# Patient Record
Sex: Female | Born: 1995 | Race: White | Hispanic: No | Marital: Single | Smoking: Never smoker
Health system: Southern US, Community
[De-identification: ages and names within clinical notes are randomized; demographics above are authoritative.]

## PROBLEM LIST (undated history)

## (undated) DIAGNOSIS — N159 Renal tubulo-interstitial disease, unspecified: Secondary | ICD-10-CM

## (undated) DIAGNOSIS — T7840XA Allergy, unspecified, initial encounter: Secondary | ICD-10-CM

## (undated) DIAGNOSIS — J45909 Unspecified asthma, uncomplicated: Secondary | ICD-10-CM

## (undated) HISTORY — DX: Renal tubulo-interstitial disease, unspecified: N15.9

## (undated) HISTORY — DX: Unspecified asthma, uncomplicated: J45.909

## (undated) HISTORY — DX: Allergy, unspecified, initial encounter: T78.40XA

---
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
## 2014-06-05 ENCOUNTER — Other Ambulatory Visit: Payer: Self-pay | Admitting: Physician Assistant

## 2014-06-09 ENCOUNTER — Other Ambulatory Visit: Payer: Self-pay | Admitting: Physician Assistant

## 2014-07-07 ENCOUNTER — Ambulatory Visit (INDEPENDENT_AMBULATORY_CARE_PROVIDER_SITE_OTHER): Payer: PPO

## 2014-07-07 ENCOUNTER — Ambulatory Visit (INDEPENDENT_AMBULATORY_CARE_PROVIDER_SITE_OTHER): Payer: PPO | Admitting: Family Medicine

## 2014-07-07 VITALS — BP 112/62 | HR 78 | Temp 98.7°F | Resp 18 | Ht 61.0 in | Wt 121.0 lb

## 2014-07-07 DIAGNOSIS — R309 Painful micturition, unspecified: Secondary | ICD-10-CM

## 2014-07-07 DIAGNOSIS — R0789 Other chest pain: Secondary | ICD-10-CM

## 2014-07-07 DIAGNOSIS — N23 Unspecified renal colic: Secondary | ICD-10-CM

## 2014-07-07 DIAGNOSIS — R1013 Epigastric pain: Secondary | ICD-10-CM

## 2014-07-07 DIAGNOSIS — G8929 Other chronic pain: Secondary | ICD-10-CM

## 2014-07-07 LAB — POCT UA - MICROSCOPIC ONLY
CASTS, UR, LPF, POC: NEGATIVE
CRYSTALS, UR, HPF, POC: NEGATIVE
Mucus, UA: NEGATIVE
YEAST UA: NEGATIVE

## 2014-07-07 LAB — POCT CBC
GRANULOCYTE PERCENT: 48.5 % (ref 37–80)
HCT, POC: 39.7 % (ref 37.7–47.9)
Hemoglobin: 12.9 g/dL (ref 12.2–16.2)
Lymph, poc: 4.3 — AB (ref 0.6–3.4)
MCH, POC: 27.6 pg (ref 27–31.2)
MCHC: 32.6 g/dL (ref 31.8–35.4)
MCV: 84.5 fL (ref 80–97)
MID (CBC): 0.1 (ref 0–0.9)
MPV: 9 fL (ref 0–99.8)
PLATELET COUNT, POC: 250 10*3/uL (ref 142–424)
POC Granulocyte: 4.2 (ref 2–6.9)
POC LYMPH PERCENT: 50 %L (ref 10–50)
POC MID %: 1.5 % (ref 0–12)
RBC: 4.69 M/uL (ref 4.04–5.48)
RDW, POC: 14.1 %
WBC: 8.7 10*3/uL (ref 4.6–10.2)

## 2014-07-07 LAB — POCT URINALYSIS DIPSTICK
Bilirubin, UA: NEGATIVE
GLUCOSE UA: NEGATIVE
Ketones, UA: NEGATIVE
NITRITE UA: NEGATIVE
PH UA: 7
PROTEIN UA: NEGATIVE
Spec Grav, UA: 1.015
UROBILINOGEN UA: 0.2

## 2014-07-07 LAB — POCT URINE PREGNANCY: Preg Test, Ur: NEGATIVE

## 2014-07-07 MED ORDER — CYCLOBENZAPRINE HCL 10 MG PO TABS: 10.0000 mg | ORAL_TABLET | Freq: Two times a day (BID) | ORAL | Status: AC | PRN

## 2014-07-07 MED ORDER — NITROFURANTOIN MONOHYD MACRO 100 MG PO CAPS: 100.0000 mg | ORAL_CAPSULE | Freq: Two times a day (BID) | ORAL | Status: AC

## 2014-07-07 NOTE — Patient Instructions (Signed)
I will get in touch with you with your other lab results.  In the meantime use the macrobid twice a day for possible UTI and the flexeril as needed for back pain. This is a muscle relaxer and may make you feel sleepy.  Do not take it when you need to drive.    Let me know if you are getting worse or if you are not better in the next couple of days.

## 2014-07-07 NOTE — Progress Notes (Addendum)
[facility] [address], [place] [address] [phone]  Date:  07/07/2014   Name:  Kayla SayersBriana Thornton   DOB:  1996/08/01   MRN:  [id number]  PCP:  No PCP Per Patient    Chief Complaint: Dysuria; Flank Pain; Abdominal Pain; Headache; and Adenopathy   History of Present Illness:  Kayla Thornton is a 18 y.o. very pleasant female patient who presents with the following:  Here today as a new patient with a possible kidney infection or UTI. She has a kidney infection a few years ago and notes similar sx.  She has noted chest, stomach and back pains for about one week.  This am she noted that it hurt to urinate. This has continued today.  She has noted frequency and urgency as well.   No hematuria.   She will notice a "tight feeling" in her chest that may occur a few times a day and last 5-20 minutes.  She will sometimes have a cough or SOB.  She has noted some sneezing and runny nose- thought it was allergies.   She has not noted a fever but has had some chills Also, she notes bilateral mid back pain for "a little over a week." Also, epigastric abdominal pain for about one week.  This does come and go.  No vomiting, but she has had some nausea.  No diarrhea.  She is able to eat ok She is generally in good health   There are no active problems to display for this patient.   Past Medical History  Diagnosis Date  . Allergy   . Asthma   . Kidney infection     No past surgical history on file.  History  Substance Use Topics  . Smoking status: Never Smoker   . Smokeless tobacco: Not on file  . Alcohol Use: No    Family History  Problem Relation Age of Onset  . Mental illness Brother   . Mental illness Sister     Allergies  Allergen Reactions  . Other     Dairy products     Medication list has been reviewed and updated.  Current Outpatient Prescriptions on File Prior to Visit  Medication Sig Dispense Refill  . loratadine (CLARITIN) 10 MG tablet  Take 10 mg by mouth daily.     No current facility-administered medications on file prior to visit.    Review of Systems:  As per HPI- otherwise negative.   Physical Examination: Filed Vitals:   07/07/14 1501  BP: 112/62  Pulse: 78  Temp: 98.7 F (37.1 C)  Resp: 18   Filed Vitals:   07/07/14 1501  Height: 5\' 1"  (1.549 m)  Weight: 121 lb (54.885 kg)   Body mass index is 22.87 kg/(m^2). Ideal Body Weight: Weight in (lb) to have BMI = 25: 132  GEN: WDWN, NAD, Non-toxic, A & O x 3, looks well HEENT: Atraumatic, Normocephalic. Neck supple. No masses, No LAD.  Bilateral TM wnl, oropharynx normal.  PEERL,EOMI.   Ears and Nose: No external deformity. CV: RRR, No M/G/R. No JVD. No thrill. No extra heart sounds. Able to reproduce her chest pain sx by pressing on her chest wall PULM: CTA B, no wheezes, crackles, rhonchi. No retractions. No resp. distress. No accessory muscle use. ABD: S, NT, ND, +BS. No rebound. No HSM.  No CVA tenderness.  Benign abd exam at this time EXTR: No c/c/e NEURO Normal gait.  PSYCH: Normally interactive. Conversant. Not depressed or  anxious appearing.  Calm demeanor.   Results for orders placed or performed in visit on 07/07/14  POCT urinalysis dipstick  Result Value Ref Range   Color, UA yellow    Clarity, UA clear    Glucose, UA neg    Bilirubin, UA neg    Ketones, UA neg    Spec Grav, UA 1.015    Blood, UA trace-lysed    pH, UA 7.0    Protein, UA neg    Urobilinogen, UA 0.2    Nitrite, UA neg    Leukocytes, UA Trace   POCT UA - Microscopic Only  Result Value Ref Range   WBC, Ur, HPF, POC 0-4    RBC, urine, microscopic 0-2    Bacteria, U Microscopic trace    Mucus, UA neg    Epithelial cells, urine per micros 0-3    Crystals, Ur, HPF, POC neg    Casts, Ur, LPF, POC neg    Yeast, UA neg   POCT urine pregnancy  Result Value Ref Range   Preg Test, Ur Negative   POCT CBC  Result Value Ref Range   WBC 8.7 4.6 - 10.2 K/uL   Lymph, poc  4.3 (A) 0.6 - 3.4   POC LYMPH PERCENT 50.0 10 - 50 %L   MID (cbc) 0.1 0 - 0.9   POC MID % 1.5 0 - 12 %M   POC Granulocyte 4.2 2 - 6.9   Granulocyte percent 48.5 37 - 80 %G   RBC 4.69 4.04 - 5.48 M/uL   Hemoglobin 12.9 12.2 - 16.2 g/dL   HCT, POC 78.239.7 95.637.7 - 47.9 %   MCV 84.5 80 - 97 fL   MCH, POC 27.6 27 - 31.2 pg   MCHC 32.6 31.8 - 35.4 g/dL   RDW, POC 21.314.1 %   Platelet Count, POC 250 142 - 424 K/uL   MPV 9.0 0 - 99.8 fL    EKG: NSR, no ST elevation or depression. Rate 67 BPM  [facility] reading (PRIMARY) by  Dr. Patsy Lageropland. CXR: negative CHEST 2 VIEW  COMPARISON: None.  FINDINGS: The heart size and mediastinal contours are within normal limits. Both lungs are clear. The visualized skeletal structures are unremarkable.  IMPRESSION: No active cardiopulmonary disease.  Assessment and Plan: Pain with urination - Plan: POCT urinalysis dipstick, POCT UA - Microscopic Only, Urine culture, nitrofurantoin, macrocrystal-monohydrate, (MACROBID) 100 MG capsule  Kidney pain - Plan: POCT urinalysis dipstick, POCT UA - Microscopic Only, Urine culture, cyclobenzaprine (FLEXERIL) 10 MG tablet  Abdominal pain, chronic, epigastric - Plan: POCT urine pregnancy, POCT CBC, Comprehensive metabolic panel  Other chest pain - Plan: EKG 12-Lead, DG Chest 2 View, cyclobenzaprine (FLEXERIL) 10 MG tablet  Kayla Thornton is here today with several different concerns including dysuria, chest pain, back pain and abdominal pain Counseled that her urine may be consistent with a UTI- will send for a culture and start tx with macrobid.  Cautioned regarding possible interaction with OCP Chest pain: her EKG and CXR are reassuring.  Able to reproduce pain on exam.  Suspect chest wall pain, she may use tylenol or flexeril as needed abd pain: unclear etiology but no danger signs.  Will await CMP.  May be related to UTI Back pain: suspect due to MSK pain, not actually kidney pain.  Await CMP, treat with  macrobid  Signed Abbe AmsterdamJessica Jayon Matton, MD  Received the rest of her labs and called her.  She is feeling better.  She will let me know if  any other concerns.    Results for orders placed or performed in visit on 07/07/14  Urine culture  Result Value Ref Range   Colony Count 20,OOO COLONIES/ML    Organism ID, Bacteria Multiple bacterial morphotypes present, none    Organism ID, Bacteria predominant. Suggest appropriate recollection if     Organism ID, Bacteria clinically indicated.   Comprehensive metabolic panel  Result Value Ref Range   Sodium 136 135 - 145 mEq/L   Potassium 4.9 3.5 - 5.3 mEq/L   Chloride 103 96 - 112 mEq/L   CO2 22 19 - 32 mEq/L   Glucose, Bld 80 70 - 99 mg/dL   BUN 8 6 - 23 mg/dL   Creat 1.61 0.96 - 0.45 mg/dL   Total Bilirubin 0.8 0.2 - 1.2 mg/dL   Alkaline Phosphatase 43 39 - 117 U/L   AST 33 0 - 37 U/L   ALT 16 0 - 35 U/L   Total Protein 7.4 6.0 - 8.3 g/dL   Albumin 4.4 3.5 - 5.2 g/dL   Calcium 9.4 8.4 - 40.9 mg/dL  POCT urinalysis dipstick  Result Value Ref Range   Color, UA yellow    Clarity, UA clear    Glucose, UA neg    Bilirubin, UA neg    Ketones, UA neg    Spec Grav, UA 1.015    Blood, UA trace-lysed    pH, UA 7.0    Protein, UA neg    Urobilinogen, UA 0.2    Nitrite, UA neg    Leukocytes, UA Trace   POCT UA - Microscopic Only  Result Value Ref Range   WBC, Ur, HPF, POC 0-4    RBC, urine, microscopic 0-2    Bacteria, U Microscopic trace    Mucus, UA neg    Epithelial cells, urine per micros 0-3    Crystals, Ur, HPF, POC neg    Casts, Ur, LPF, POC neg    Yeast, UA neg   POCT urine pregnancy  Result Value Ref Range   Preg Test, Ur Negative   POCT CBC  Result Value Ref Range   WBC 8.7 4.6 - 10.2 K/uL   Lymph, poc 4.3 (A) 0.6 - 3.4   POC LYMPH PERCENT 50.0 10 - 50 %L   MID (cbc) 0.1 0 - 0.9   POC MID % 1.5 0 - 12 %M   POC Granulocyte 4.2 2 - 6.9   Granulocyte percent 48.5 37 - 80 %G   RBC 4.69 4.04 - 5.48 M/uL   Hemoglobin 12.9  12.2 - 16.2 g/dL   HCT, POC 81.1 91.4 - 47.9 %   MCV 84.5 80 - 97 fL   MCH, POC 27.6 27 - 31.2 pg   MCHC 32.6 31.8 - 35.4 g/dL   RDW, POC 78.2 %   Platelet Count, POC 250 142 - 424 K/uL   MPV 9.0 0 - 99.8 fL

## 2014-07-08 LAB — COMPREHENSIVE METABOLIC PANEL
ALBUMIN: 4.4 g/dL (ref 3.5–5.2)
ALK PHOS: 43 U/L (ref 39–117)
ALT: 16 U/L (ref 0–35)
AST: 33 U/L (ref 0–37)
BUN: 8 mg/dL (ref 6–23)
CALCIUM: 9.4 mg/dL (ref 8.4–10.5)
CHLORIDE: 103 meq/L (ref 96–112)
CO2: 22 mEq/L (ref 19–32)
CREATININE: 0.6 mg/dL (ref 0.50–1.10)
Glucose, Bld: 80 mg/dL (ref 70–99)
POTASSIUM: 4.9 meq/L (ref 3.5–5.3)
Sodium: 136 mEq/L (ref 135–145)
Total Bilirubin: 0.8 mg/dL (ref 0.2–1.2)
Total Protein: 7.4 g/dL (ref 6.0–8.3)

## 2014-07-08 LAB — URINE CULTURE

## 2014-12-31 IMAGING — CR DG CHEST 2V
2 series · 2 of 2 positions shown · non-contrast
Comparison: None.

CLINICAL DATA: Chest pain

EXAM:
CHEST  2 VIEW

[PA]
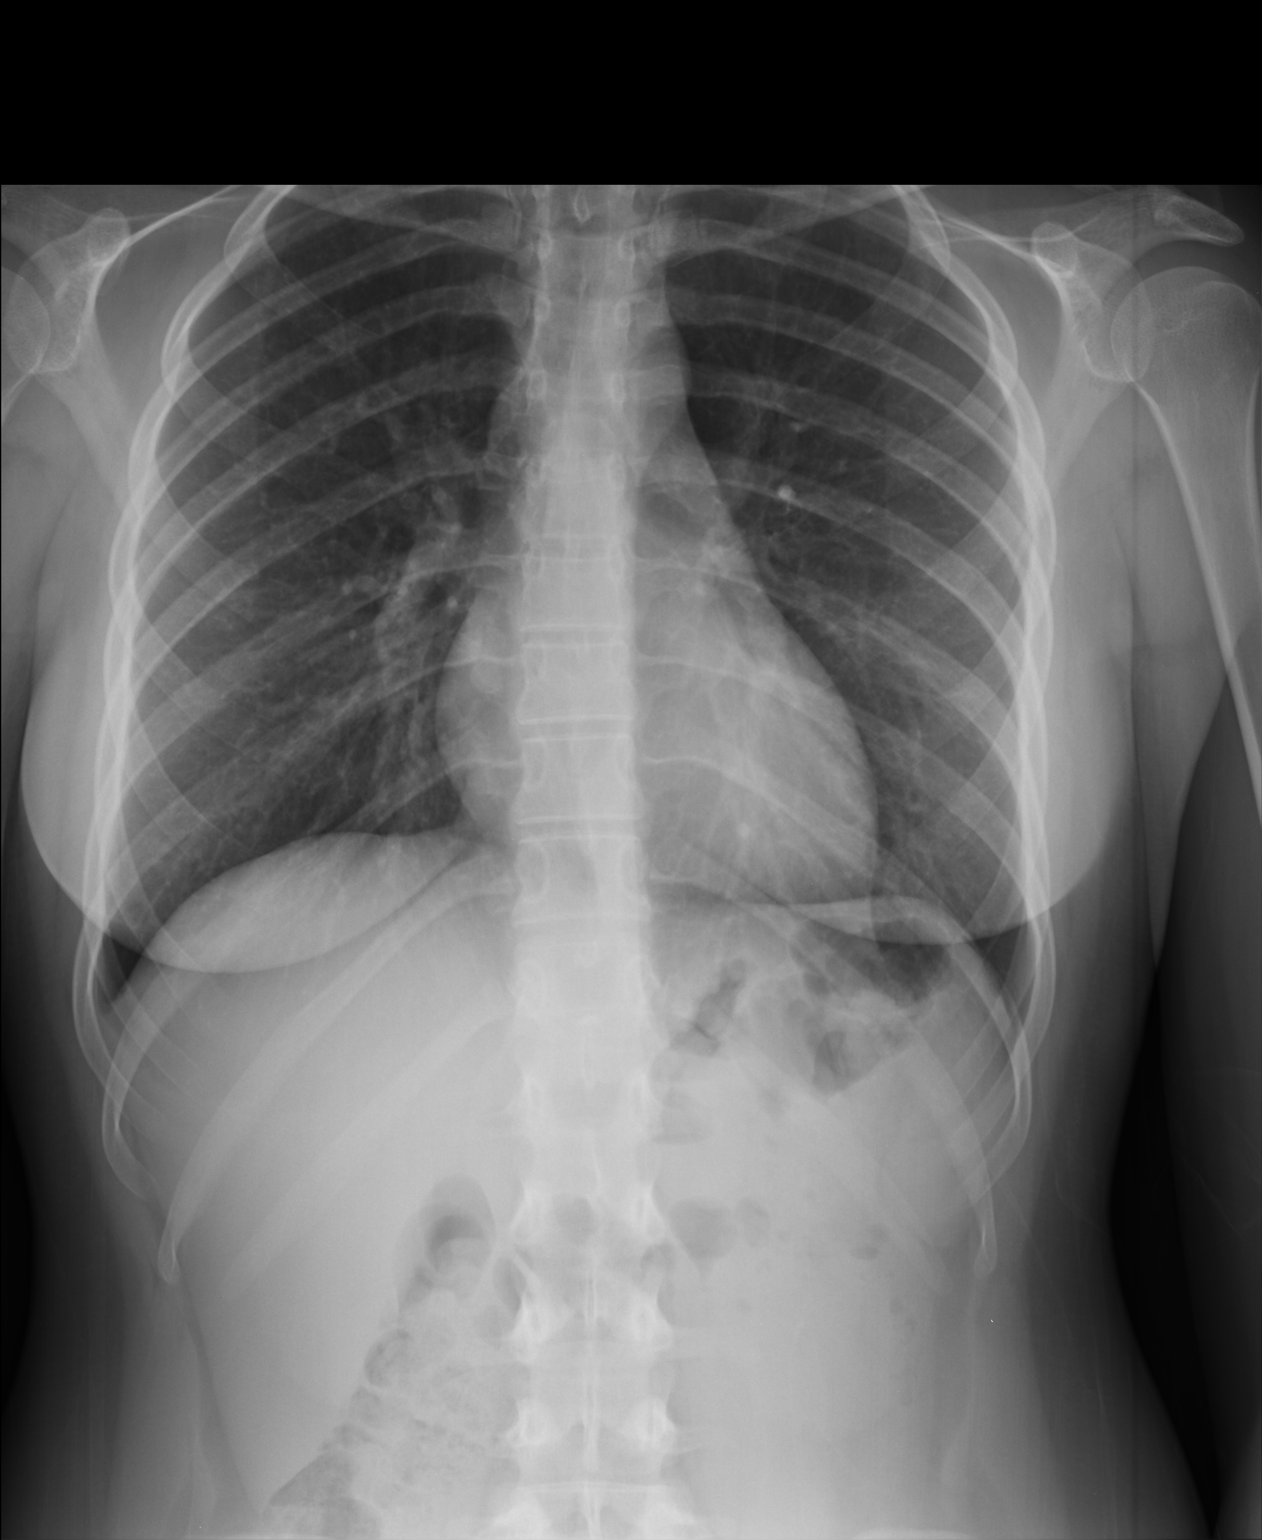

[lateral]
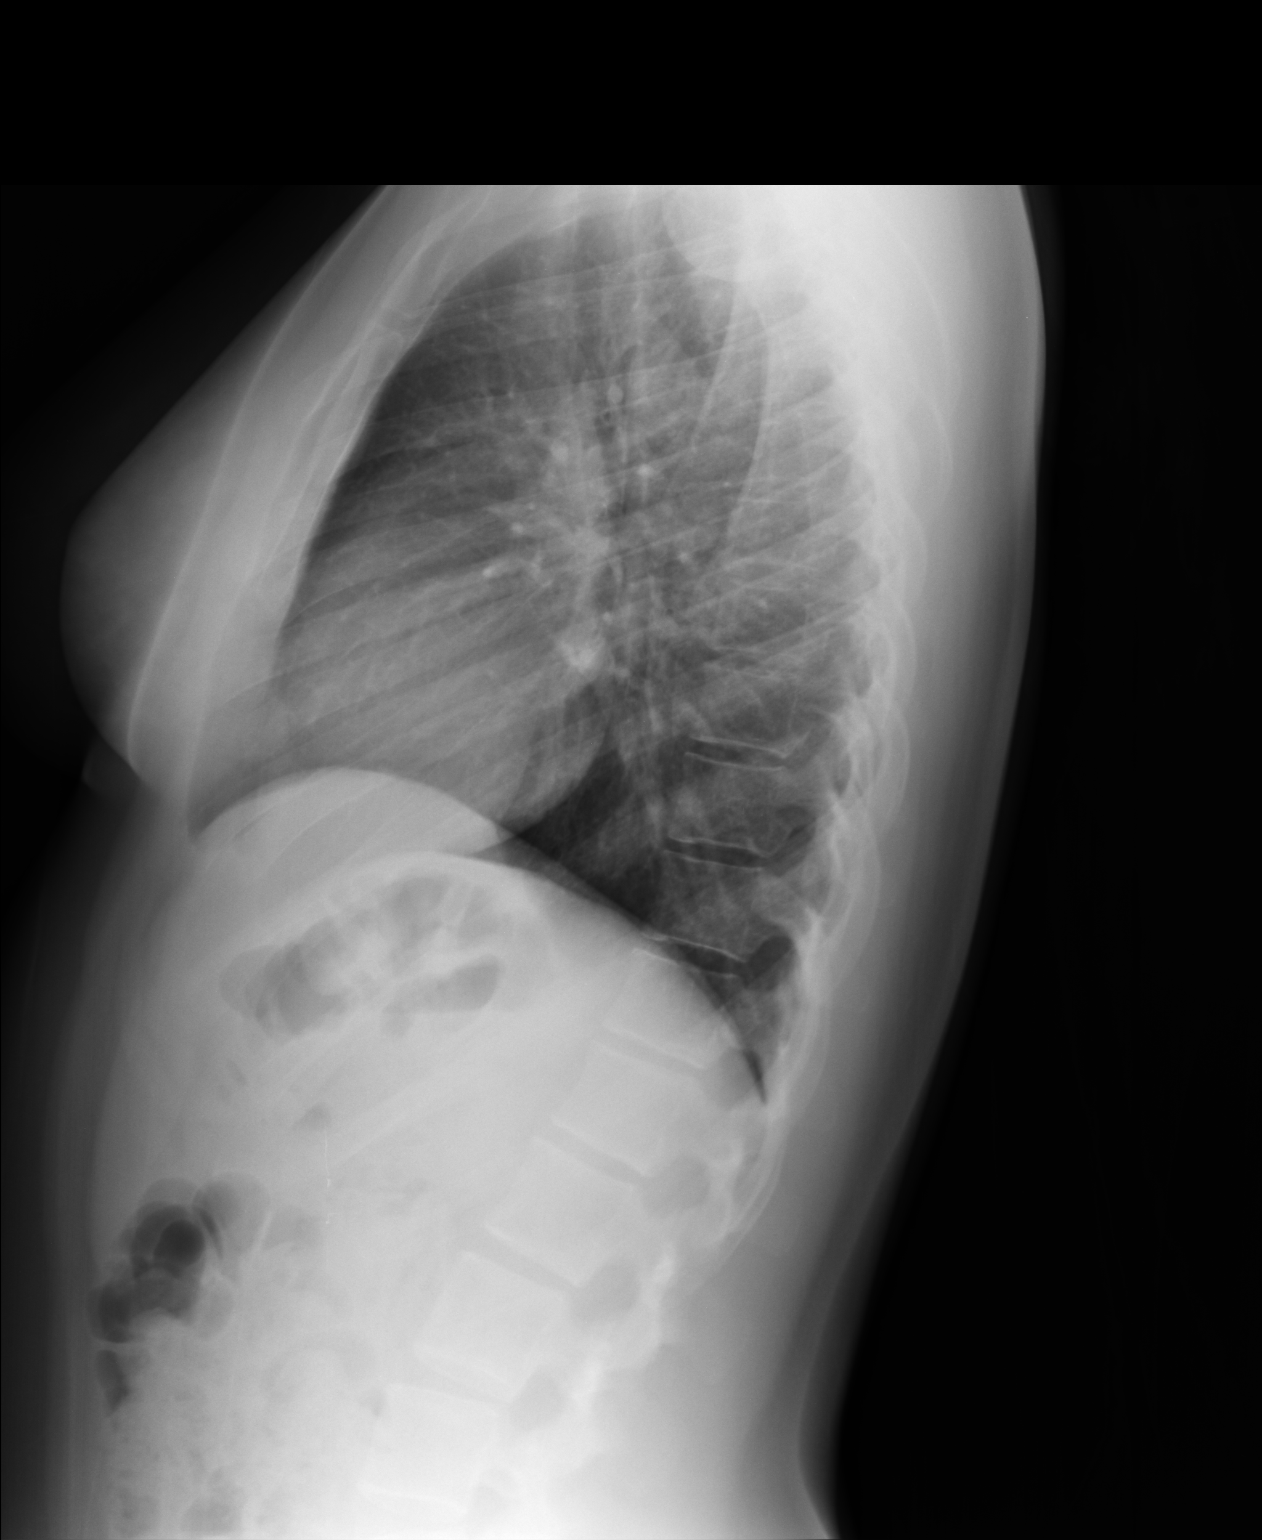

[2 of 2 positions shown; findings below may reference images not displayed]

FINDINGS: The heart size and mediastinal contours are within normal limits.
Both lungs are clear. The visualized skeletal structures are
unremarkable.
IMPRESSION: No active cardiopulmonary disease.
